# Patient Record
Sex: Male | Born: 1985 | Race: White | Hispanic: No | Marital: Single | Smoking: Never smoker
Health system: Southern US, Community
[De-identification: ages and names within clinical notes are randomized; demographics above are authoritative.]

## PROBLEM LIST (undated history)

## (undated) DIAGNOSIS — I10 Essential (primary) hypertension: Secondary | ICD-10-CM

## (undated) HISTORY — DX: Essential (primary) hypertension: I10

---

## 2016-02-13 DIAGNOSIS — H16103 Unspecified superficial keratitis, bilateral: Secondary | ICD-10-CM | POA: Diagnosis not present

## 2016-02-23 DIAGNOSIS — H16103 Unspecified superficial keratitis, bilateral: Secondary | ICD-10-CM | POA: Diagnosis not present

## 2016-04-10 DIAGNOSIS — H16103 Unspecified superficial keratitis, bilateral: Secondary | ICD-10-CM | POA: Diagnosis not present

## 2016-05-08 DIAGNOSIS — H5213 Myopia, bilateral: Secondary | ICD-10-CM | POA: Diagnosis not present

## 2016-06-28 DIAGNOSIS — H16103 Unspecified superficial keratitis, bilateral: Secondary | ICD-10-CM | POA: Diagnosis not present

## 2016-07-17 DIAGNOSIS — H16103 Unspecified superficial keratitis, bilateral: Secondary | ICD-10-CM | POA: Diagnosis not present

## 2016-10-16 ENCOUNTER — Encounter: Payer: Self-pay | Admitting: Family Medicine

## 2016-10-16 ENCOUNTER — Ambulatory Visit (INDEPENDENT_AMBULATORY_CARE_PROVIDER_SITE_OTHER): Payer: BLUE CROSS/BLUE SHIELD | Admitting: Family Medicine

## 2016-10-16 VITALS — BP 136/90 | HR 75 | Resp 12 | Ht 68.0 in | Wt 206.1 lb

## 2016-10-16 DIAGNOSIS — R030 Elevated blood-pressure reading, without diagnosis of hypertension: Secondary | ICD-10-CM

## 2016-10-16 DIAGNOSIS — L309 Dermatitis, unspecified: Secondary | ICD-10-CM

## 2016-10-16 DIAGNOSIS — R1033 Periumbilical pain: Secondary | ICD-10-CM

## 2016-10-16 MED ORDER — CLOTRIMAZOLE-BETAMETHASONE 1-0.05 % EX CREA: 1.0000 "application " | TOPICAL_CREAM | Freq: Two times a day (BID) | CUTANEOUS | 1 refills | Status: AC

## 2016-10-16 MED ORDER — MUPIROCIN CALCIUM 2 % EX CREA: 1.0000 "application " | TOPICAL_CREAM | Freq: Every day | CUTANEOUS | 0 refills | Status: AC

## 2016-10-16 NOTE — Progress Notes (Addendum)
HPI:   [name] is a 31 y.o. male, who is here today to establish care with me.  Former PCP: N/A Last preventive routine visit: Many years ago.  Chronic medical problems: Elevated BP readings, improved with low salt diet. He does not check BP's at home.  He exercises regularly and tries to follow a healthy diet. About once per week he drinks energy drinks before exercising.   Concerns today: Discharge from belly bottom and abdominal pain.  2 weeks ago he noted whitish drainage from umbilicus, associated pruritus. It is getting better. He has not tried OTC treatments. He has not used new detergent,soap,or bloody product. No insect bites or outdoors exposure. He has no prior Hx.   1 week ago he had some periumbilical abdominal pain,constant,"tightness" sensation,2-3/10,no radiated,no associated nausea or vomiting.He is afraid that this pain could be related to umbilical problem. He tells me that he was visiting his parents and ate "too much." No Hx of injuries or activities that could cause problem.  Pain has resolved.   Review of Systems  Constitutional: Negative for activity change, appetite change, fatigue, fever and unexpected weight change.  HENT: Negative for mouth sores, nosebleeds, sore throat and trouble swallowing.   Eyes: Negative for redness and visual disturbance.  Respiratory: Negative for cough, shortness of breath and wheezing.   Cardiovascular: Negative for chest pain, palpitations and leg swelling.  Gastrointestinal: Negative for blood in stool, nausea and vomiting.       No changes in bowel habits.  Genitourinary: Negative for decreased urine volume, dysuria and hematuria.  Musculoskeletal: Negative for arthralgias, back pain and myalgias.  Skin: Positive for rash. Negative for wound.  Neurological: Negative for syncope, weakness and headaches.  Hematological: Negative for adenopathy. Does not bruise/bleed easily.    Psychiatric/Behavioral: Negative for confusion. The patient is not nervous/anxious.       No current outpatient prescriptions on file prior to visit.   No current facility-administered medications on file prior to visit.      Past Medical History:  Diagnosis Date  . Hypertension    No Known Allergies  Family History  Problem Relation Age of Onset  . Diabetes Father   . Hyperlipidemia Sister     Social History   Social History  . Marital status: Single    Spouse name: N/A  . Number of children: N/A  . Years of education: N/A   Social History Main Topics  . Smoking status: Never Smoker  . Smokeless tobacco: Never Used  . Alcohol use Yes  . Drug use: No  . Sexual activity: Not Asked   Other Topics Concern  . None   Social History Narrative  . None    Vitals:   10/16/16 1159  BP: 136/90  Pulse: 75  Resp: 12  O2 sat at RA 98%  Body mass index is 31.34 kg/m.   Physical Exam  Constitutional: He is oriented to person, place, and time. He appears well-developed. No distress.  HENT:  Head: Atraumatic.  Mouth/Throat: Oropharynx is clear and moist and mucous membranes are normal.  Eyes: Conjunctivae and EOM are normal. Pupils are equal, round, and reactive to light.  Neck: No thyroid mass and no thyromegaly present.  Cardiovascular: Normal rate and regular rhythm.   No murmur heard. Pulses:      Dorsalis pedis pulses are 2+ on the right side, and 2+ on the left side.  Respiratory: Effort normal and breath sounds normal. No respiratory distress.  GI: Soft. He exhibits no mass. There is no hepatomegaly. There is no tenderness.  Musculoskeletal: He exhibits no edema.  Lymphadenopathy:    He has no cervical adenopathy.  Neurological: He is alert and oriented to person, place, and time. He has normal strength. Coordination and gait normal.  Skin: Skin is warm. Rash noted. No abrasion and no ecchymosis noted. Rash is macular. Rash is not vesicular. There is  erythema.  In umbilicus with mild erythema,no tenderness,no induration.Clear exudate appreciated,not odorous.  Psychiatric: He has a normal mood and affect.  Well groomed, good eye contact.      ASSESSMENT AND PLAN:     Bradley Pope was seen today for establish care.  Diagnoses and all orders for this visit:  Dermatitis  ? Intertrigo. Keep area dry, alcohol with a q tip can be used. Topical abx cream once daily x 7 days. Lotrisone bid,small amount for up to 14 days. It can be recurrent. Monitor for signs of infection. F/U as needed.  -     clotrimazole-betamethasone (LOTRISONE) cream; Apply 1 application topically 2 (two) times daily. -     mupirocin cream (BACTROBAN) 2 %; Apply 1 application topically daily.  Elevated blood pressure reading  Instructed to monitor BP at least once per week. Continue following low salt/DASH diet. We will follow in 3-4 months, if BP still elevated we will consider antihypertensive therapy.  Periumbilical abdominal pain  Results. I don't think this is related to umbilical rash. Instructed about warning signs and follow up as needed.    Jimmye Wisnieski G. [place], MD  [facility]. [place] office.

## 2016-10-16 NOTE — Progress Notes (Signed)
Pre visit review using our clinic review tool, if applicable. No additional management support is needed unless otherwise documented below in the visit note. 

## 2016-10-16 NOTE — Patient Instructions (Signed)
A few things to remember from today's visit:   Dermatitis - Plan: clotrimazole-betamethasone (LOTRISONE) cream, mupirocin cream (BACTROBAN) 2 %  Elevated blood pressure reading  Blood pressure goal for most people is less than 140/90. Some populations (older than 60) the goal is less than 150/90.  Most recent cardiologists' recommendations recommend blood pressure at or less than 130/80.   Elevated blood pressure increases the risk of strokes, heart and kidney disease, and eye problems. Regular physical activity and a healthy diet (DASH diet) usually help. Low salt diet. Take medications as instructed.  Caution with some over the counter medications as cold medications, dietary products (for weight loss), and Ibuprofen or Aleve (frequent use);all these medications could cause elevation of blood pressure.   Please be sure medication list is accurate. If a new problem present, please set up appointment sooner than planned today.

## 2017-04-24 DIAGNOSIS — H16103 Unspecified superficial keratitis, bilateral: Secondary | ICD-10-CM | POA: Diagnosis not present

## 2017-05-29 DIAGNOSIS — Z23 Encounter for immunization: Secondary | ICD-10-CM | POA: Diagnosis not present

## 2017-07-03 DIAGNOSIS — H16101 Unspecified superficial keratitis, right eye: Secondary | ICD-10-CM | POA: Diagnosis not present

## 2017-08-20 ENCOUNTER — Encounter: Payer: Self-pay | Admitting: Family Medicine

## 2017-08-20 ENCOUNTER — Ambulatory Visit (INDEPENDENT_AMBULATORY_CARE_PROVIDER_SITE_OTHER): Payer: BLUE CROSS/BLUE SHIELD | Admitting: Family Medicine

## 2017-08-20 VITALS — BP 126/72 | HR 74 | Temp 98.2°F | Resp 12 | Ht 68.0 in | Wt 227.0 lb

## 2017-08-20 DIAGNOSIS — Z0000 Encounter for general adult medical examination without abnormal findings: Secondary | ICD-10-CM | POA: Diagnosis not present

## 2017-08-20 DIAGNOSIS — Z13220 Encounter for screening for lipoid disorders: Secondary | ICD-10-CM

## 2017-08-20 DIAGNOSIS — Z131 Encounter for screening for diabetes mellitus: Secondary | ICD-10-CM

## 2017-08-20 NOTE — Progress Notes (Signed)
HPI:  [name] is a 32 y.o.male here today for his routine physical examination.  Last CPE: Over a year ago. He lives with his sister.  Regular exercise 3 or more times per week: He plays basketball and lifts wts 2-3 times per week. Following a healthy diet: Yes.   Chronic medical problems: Otherwise healthy.  Hx of STD's: Denies.   There is no immunization history on file for this patient.  -Denies high alcohol intake, tobacco use, or Hx of illicit drug use.  -Concerns and/or follow up today:   He would like lab work done,concerned about CVD risk. His first cousin recently died from CVA at age 31 a couple days ago.  For about 2 days he has been more aware of his breathing,states that a few times he feels like he needs to breath deeper. This happens at rest, no dyspnea or chest pain. He denies associated diaphoresis. He is under some stress at work.  He reports an ER visit about 7 years ago because SOB, states that work up included EKG and CXR and "everything was fine."  Review of Systems  Constitutional: Negative for activity change, appetite change, fatigue, fever and unexpected weight change.  HENT: Negative for dental problem, mouth sores, nosebleeds, sore throat, trouble swallowing and voice change.   Eyes: Negative for redness and visual disturbance.  Respiratory: Negative for apnea, cough, shortness of breath and wheezing.   Cardiovascular: Negative for chest pain, palpitations and leg swelling.  Gastrointestinal: Negative for abdominal pain, blood in stool, nausea and vomiting.  Endocrine: Negative for cold intolerance, heat intolerance, polydipsia, polyphagia and polyuria.  Genitourinary: Negative for decreased urine volume, dysuria, genital sores, hematuria and testicular pain.  Musculoskeletal: Negative for back pain, gait problem and myalgias.  Skin: Negative for color change and rash.  Neurological: Negative for dizziness, seizures, syncope,  weakness, numbness and headaches.  Hematological: Negative for adenopathy. Does not bruise/bleed easily.  Psychiatric/Behavioral: Negative for confusion and sleep disturbance. The patient is nervous/anxious.   All other systems reviewed and are negative.    No current outpatient medications on file prior to visit.   No current facility-administered medications on file prior to visit.      Past Medical History:  Diagnosis Date  . Hypertension     History reviewed. No pertinent surgical history.  No Known Allergies  Family History  Problem Relation Age of Onset  . Diabetes Father   . Hyperlipidemia Father   . Hyperlipidemia Sister   . Stroke Maternal Uncle     Social History   Socioeconomic History  . Marital status: Single    Spouse name: None  . Number of children: None  . Years of education: None  . Highest education level: None  Social Needs  . Financial resource strain: None  . Food insecurity - worry: None  . Food insecurity - inability: None  . Transportation needs - medical: None  . Transportation needs - non-medical: None  Occupational History  . None  Tobacco Use  . Smoking status: Never Smoker  . Smokeless tobacco: Never Used  Substance and Sexual Activity  . Alcohol use: Yes  . Drug use: No  . Sexual activity: No  Other Topics Concern  . None  Social History Narrative  . None     Vitals:   08/20/17 1037  BP: 126/72  Pulse: 74  Resp: 12  Temp: 98.2 F (36.8 C)  SpO2: 98%   Body mass index is 34.52  kg/m.   Wt Readings from Last 3 Encounters:  08/20/17 227 lb (103 kg)  10/16/16 206 lb 2 oz (93.5 kg)    Physical Exam  Nursing note and vitals reviewed. Constitutional: He is oriented to person, place, and time. He appears well-developed. No distress.  HENT:  Head: Normocephalic and atraumatic.  Right Ear: Hearing, tympanic membrane, external ear and ear canal normal.  Left Ear: Hearing, tympanic membrane, external ear and ear  canal normal.  Mouth/Throat: Oropharynx is clear and moist and mucous membranes are normal.  Eyes: Conjunctivae and EOM are normal. Pupils are equal, round, and reactive to light.  Neck: Normal range of motion. No tracheal deviation present. No thyromegaly present.  Cardiovascular: Normal rate and regular rhythm.  No murmur heard. Pulses:      Dorsalis pedis pulses are 2+ on the right side, and 2+ on the left side.  Respiratory: Effort normal and breath sounds normal. No respiratory distress. He exhibits no tenderness.  GI: Soft. He exhibits no mass. There is no tenderness.  Genitourinary:  Genitourinary Comments: Refused,no concerns.  Musculoskeletal: He exhibits no edema or tenderness.  No major deformities appreciated and no signs of synovitis.  Lymphadenopathy:    He has no cervical adenopathy.       Right: No supraclavicular adenopathy present.       Left: No supraclavicular adenopathy present.  Neurological: He is alert and oriented to person, place, and time. He has normal strength. No cranial nerve deficit or sensory deficit. Coordination and gait normal.  Reflex Scores:      Bicep reflexes are 2+ on the right side and 2+ on the left side.      Patellar reflexes are 2+ on the right side and 2+ on the left side. Skin: Skin is warm. No rash noted. No erythema.  Psychiatric: Cognition and memory are normal.  Well groomed,good eye contact.     ASSESSMENT AND PLAN:   Mr Bradley Pope was seen today for annual exam.  Diagnoses and all orders for this visit:  Lab Results  Component Value Date   CHOL 179 08/21/2017   HDL 48.20 08/21/2017   LDLCALC 115 (H) 08/21/2017   TRIG 83.0 08/21/2017   CHOLHDL 4 08/21/2017   Lab Results  Component Value Date   CREATININE 0.99 08/21/2017   BUN 14 08/21/2017   NA 138 08/21/2017   K 4.2 08/21/2017   CL 102 08/21/2017   CO2 30 08/21/2017    Routine general medical examination at a health care facility  We discussed the importance of  regular physical activity and healthy diet for prevention of chronic illness and/or complications. Preventive guidelines reviewed. Vaccination reported as up to date. We discussed CVD risk factors. Next CPE in a year.  Screening for diabetes mellitus -     Basic metabolic panel; Future  Screening for lipid disorders -     Lipid panel; Future     Return in 1 year (on 08/20/2018).    Bronsen Serano G. [place], MD  [facility] [place] office.

## 2017-08-20 NOTE — Patient Instructions (Signed)
A few things to remember from today's visit:   Routine general medical examination at a health care facility  Screening for diabetes mellitus - Plan: Basic metabolic panel  Screening for lipid disorders - Plan: Lipid panel   At least 150 minutes of moderate exercise per week, daily brisk walking for 15-30 min is a good exercise option. Healthy diet low in saturated (animal) fats and sweets and consisting of fresh fruits and vegetables, lean meats such as fish and white chicken and whole grains.  - Vaccines:  Tdap vaccine every 10 years.  Shingles vaccine recommended at age 31, could be given after 31 years of age but not sure about insurance coverage.  Pneumonia vaccines:  Prevnar 13 at 65 and Pneumovax at [address].   -Screening recommendations for low/normal risk males:  Screening for diabetes at age 31 and every 3 years. Earlier screening if cardiovascular risk factors.   Lipid screening at 35 and every 3 years. Screening starts in younger males with cardiovascular risk factors.  Colon cancer screening at age 31 and until age 31.  Prostate cancer screening: some controversy, starts usually at 50: Rectal exam and PSA.  Aortic Abdominal Aneurism once between 3565 and 31 years old if ever smoker.  Also recommended:  1. Dental visit- Brush and floss your teeth twice daily; visit your dentist twice a year. 2. Eye doctor- Get an eye exam at least every 2 years. 3. Helmet use- Always wear a helmet when riding a bicycle, motorcycle, rollerblading or skateboarding. 4. Safe sex- If you may be exposed to sexually transmitted infections, use a condom. 5. Seat belts- Seat belts can save your live; always wear one. 6. Smoke/Carbon Monoxide detectors- These detectors need to be installed on the appropriate level of your home. Replace batteries at least once a year. 7. Skin cancer- When out in the sun please cover up and use sunscreen 15 SPF or higher. 8. Violence- If anyone is threatening or  hurting you, please tell your healthcare provider.  9. Drink alcohol in moderation- Limit alcohol intake to one drink or less per day. Never drink and drive.  Please be sure medication list is accurate. If a new problem present, please set up appointment sooner than planned today.

## 2017-08-21 LAB — BASIC METABOLIC PANEL
BUN: 14 mg/dL (ref 6–23)
CHLORIDE: 102 meq/L (ref 96–112)
CO2: 30 mEq/L (ref 19–32)
Calcium: 9.3 mg/dL (ref 8.4–10.5)
Creatinine, Ser: 0.99 mg/dL (ref 0.40–1.50)
GFR: 93.72 mL/min (ref >60.00)
Glucose, Bld: 89 mg/dL (ref 70–99)
POTASSIUM: 4.2 meq/L (ref 3.5–5.1)
Sodium: 138 mEq/L (ref 135–145)

## 2017-08-21 LAB — LIPID PANEL
CHOLESTEROL: 179 mg/dL (ref 0–200)
HDL: 48.2 mg/dL (ref >39.00)
LDL CALC: 115 mg/dL — AB (ref 0–99)
NonHDL: 131.27
TRIGLYCERIDES: 83 mg/dL (ref 0.0–149.0)
Total CHOL/HDL Ratio: 4
VLDL: 16.6 mg/dL (ref 0.0–40.0)

## 2017-08-25 ENCOUNTER — Encounter: Payer: Self-pay | Admitting: Family Medicine

## 2017-12-17 DIAGNOSIS — H16101 Unspecified superficial keratitis, right eye: Secondary | ICD-10-CM | POA: Diagnosis not present

## 2017-12-23 ENCOUNTER — Ambulatory Visit: Payer: BLUE CROSS/BLUE SHIELD | Admitting: Sports Medicine

## 2017-12-23 ENCOUNTER — Ambulatory Visit
Admission: RE | Admit: 2017-12-23 | Discharge: 2017-12-23 | Disposition: A | Discharge status: Home / Self Care | Payer: BLUE CROSS/BLUE SHIELD | Source: Ambulatory Visit | Attending: Sports Medicine | Admitting: Sports Medicine

## 2017-12-23 VITALS — BP 126/78 | Ht 68.0 in | Wt 230.0 lb

## 2017-12-23 DIAGNOSIS — M79671 Pain in right foot: Secondary | ICD-10-CM

## 2017-12-23 MED ORDER — MELOXICAM 15 MG PO TABS: ORAL_TABLET | ORAL | 0 refills | Status: DC

## 2017-12-23 NOTE — Progress Notes (Signed)
   Subjective:    Patient ID: Bradley Pope, male    DOB: 10/05/1985, 32 y.o.   MRN: [id number]  HPI Pt presents with R foot pain for approximately 3 weeks. He was wearing old dress shoes [place] [facility] and later that day had R mid foot pain. The pain seemed to get better over the next few days and then he ran in flip flops on Memorial [place] and aggravated it. He has been having sharp mid R dorsal foot pain and thinks that he is modifying his gait to accommodate for it as he has had some R heel pain on the plantar surface for the past couple of days. He has had to stay NWB at times and borrowed a crutch to use on the R side. Pain gets up to 8-10/10. No pain currently but he limped in from the parking lot. He thinks that now some of the limping is due to fear of pain. He has been icing daily and has taken 200mg  ibuprofen on occasion (counseled on more appropriate dosing). He has also soaked it in hot water a couple of times. For exercise he bikes occasionally, usually plays basketball once a week and lifts weights. He has not done any physical activity for the past 3 weeks due to pain. He is no longer wearing those shoes. He wraps his foot at night with an ACE wrap. His foot was red and swollen the first couple of days of pain. He feels that he is rolling his R foot out laterally to help with pain. He has a hx of what he calls tendonitis in this same location 3x while in middle school and this feels similar.    Review of Systems As above    Objective:   Physical Exam Seated on the exam table in NAD. Obese. R foot: NL inspection aside from fainy erythema mid dorsum of R foot, no obvious swelling ? Minimally TTP over R proximal 3rd metatarsal, R heel is NTTP NL ROM of foot and ankle, NL dorsi and plantar flexion of foot, NL mvmt in all 5 toes NL strength of ankle and foot R ankle and foot are stable, pes planus visible with  standing NVI  Limited BS US shows inflammation and swelling overlying dorsal aspect of R 3rd metatarsal.      Assessment & Plan:  Subacute R foot pain with pes planus No acute injury but pt may have a stress fx. Will send for a 3 view R foot XR to r/o stress fx. Arch strap for extra support. Green shoe inserts with B scaphoid pads for extra support. Scaphoid pads in dress shoes as their inserts cannot be removed. Consider custom orthotics if improvement with these measures. Rx Mobic. 3 week office f/u for re-eval. Consider R foot MRI if no improvement. Cycling and swimming are fine as they are NWB. Open chain and upper body weight lifting are fine to resume. Avoid close chain exercises. (Reviewed with pt.)  Addendum: X-rays reviewed. They're unremarkable. Treatment as above and follow-up in 3 weeks. If symptoms are improving, consider custom orthotics. If symptoms persist, consider merits of further diagnostic imaging.

## 2018-01-07 DIAGNOSIS — H16101 Unspecified superficial keratitis, right eye: Secondary | ICD-10-CM | POA: Diagnosis not present

## 2018-01-13 ENCOUNTER — Ambulatory Visit: Payer: BLUE CROSS/BLUE SHIELD | Admitting: Sports Medicine

## 2018-01-13 VITALS — BP 112/72 | Ht 68.0 in | Wt 227.0 lb

## 2018-01-13 DIAGNOSIS — S93609D Unspecified sprain of unspecified foot, subsequent encounter: Secondary | ICD-10-CM

## 2018-01-13 NOTE — Progress Notes (Signed)
Subjective:    Patient ID: Bradley Pope, male    DOB: 07/19/1985, 32 y.o.   MRN: [id number]  HPI Patient is a 32 yo male who present today to follow up on his right foot pain. Patient was seen about 3 weeks ago for dorsal foot pain with pes planus. Patient was given scaphoid pads and arch strap to help with foot pain. Patient reports initially he had some pain in his right leg due to the scaphoid pad but pain improve over time as he became more used to wearing the pads. He also wore the elastic strap while at home. Last Sunday (6/26) patient was doing kettle bell exercise when  He felt pain in his foot. Patient reports that he was pain free for 3 days then last Wednesday he had similar foot pain, initially on the plantar and dorsal aspect of his foot but then mostly localized to the dorsal aspect of his right foot in the midfoot region. Patient resumed Meloxicam for about 3 days with improvement in his pain. This morning, patient reports some pain causing him to limp with ambulation but not as severe as before.  Patient wanted to have foot examine again prior to taking a trip in [place].   Review of Systems  Constitutional: Negative.   HENT: Negative.   Eyes: Negative.   Respiratory: Negative.   Cardiovascular: Negative.   Gastrointestinal: Negative.   Endocrine: Negative.   Genitourinary: Negative.   Musculoskeletal:       Right foot pain  Skin: Negative.   Neurological: Negative.   Hematological: Negative.   Psychiatric/Behavioral: Negative.        Objective:   Physical Exam Right foot: No swelling, effusion, erythema or deformity noted. Patient tender to palpation on the dorsal aspect of the right midfoot. Normal ROM for the foot and ankle, dorsiflexion and plantar flexion within normal limit. Pulses palpated, foot is warm. Strength and sensation intact.     Assessment & Plan:  Right foot pain, chronic, intermittent Patient presents today to follow up on right foot pain.  Xray of the foot was negative from any stress fracture and US in the office show evidence of inflammation. Patient appeared to be improving with scaphoid pad and NSAIDs but appears to have reinjure his foot. Unclear what the cause of his pain is. Could still have stress fracture, however for that patient would need MRI for further evaluation. Given improvement with pads and NSAIDs recommended continuation of conservative therapy with reduction in activity that could increase likelikhood of reinjury. Patient will follow up after his trip to [place] to assess need for further imaging or continuation of current therapy. Patient provided with multiple scaphoid pad insert to be used in other shoes.  Patient seen and evaluated with the resident. I agree with the above plan of care. Patient's location of pain today is primarily over the midfoot which would be an unusual source of stress injury. X-rays done 3 weeks ago were unremarkable. I think he is most likely dealing with a mild midfoot sprain. I discussed continuing with watchful waiting in addition to adding other scaphoid pads to his dress shoes versus MRI for a more definitive diagnosis. Patient prefers to continue with our current treatment for now. We've given him additional scaphoid pads for other shoes. I've asked him to contact me upon his return to [place] for an update. If his symptoms continue to improve then he will return to the office for custom orthotics. However, if symptoms  persist or worsen we may want to reconsider merits of MRI.

## 2018-01-20 ENCOUNTER — Other Ambulatory Visit: Payer: Self-pay

## 2018-01-20 MED ORDER — MELOXICAM 15 MG PO TABS: ORAL_TABLET | ORAL | 0 refills | Status: AC

## 2018-08-26 ENCOUNTER — Encounter: Payer: Self-pay | Admitting: Sports Medicine

## 2018-08-26 ENCOUNTER — Ambulatory Visit: Payer: BLUE CROSS/BLUE SHIELD | Admitting: Sports Medicine

## 2018-08-26 VITALS — BP 120/70 | Ht 68.0 in | Wt 223.0 lb

## 2018-08-26 DIAGNOSIS — M25561 Pain in right knee: Secondary | ICD-10-CM | POA: Diagnosis not present

## 2018-08-27 ENCOUNTER — Encounter: Payer: Self-pay | Admitting: Sports Medicine
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
## 2018-12-19 DIAGNOSIS — M25561 Pain in right knee: Secondary | ICD-10-CM | POA: Diagnosis not present

## 2018-12-19 DIAGNOSIS — M542 Cervicalgia: Secondary | ICD-10-CM | POA: Diagnosis not present

## 2018-12-19 DIAGNOSIS — Z6838 Body mass index (BMI) 38.0-38.9, adult: Secondary | ICD-10-CM | POA: Diagnosis not present

## 2018-12-19 DIAGNOSIS — M25562 Pain in left knee: Secondary | ICD-10-CM | POA: Diagnosis not present

## 2019-12-09 DIAGNOSIS — R1012 Left upper quadrant pain: Secondary | ICD-10-CM | POA: Diagnosis not present

## 2019-12-09 DIAGNOSIS — A084 Viral intestinal infection, unspecified: Secondary | ICD-10-CM | POA: Diagnosis not present

## 2019-12-09 DIAGNOSIS — Z6837 Body mass index (BMI) 37.0-37.9, adult: Secondary | ICD-10-CM | POA: Diagnosis not present

## 2020-01-05 DIAGNOSIS — R1013 Epigastric pain: Secondary | ICD-10-CM | POA: Diagnosis not present

## 2020-01-05 DIAGNOSIS — Z6837 Body mass index (BMI) 37.0-37.9, adult: Secondary | ICD-10-CM | POA: Diagnosis not present

## 2020-01-26 DIAGNOSIS — F4321 Adjustment disorder with depressed mood: Secondary | ICD-10-CM | POA: Diagnosis not present

## 2020-02-04 DIAGNOSIS — F4321 Adjustment disorder with depressed mood: Secondary | ICD-10-CM | POA: Diagnosis not present

## 2020-02-07 IMAGING — CR DG FOOT COMPLETE 3+V*R*
3 series · 3 of 3 positions shown · non-contrast
Comparison: None.

CLINICAL DATA: Metatarsal pain over the last 2 weeks.

EXAM:
RIGHT FOOT COMPLETE - 3+ VIEW

[t foot ap right]
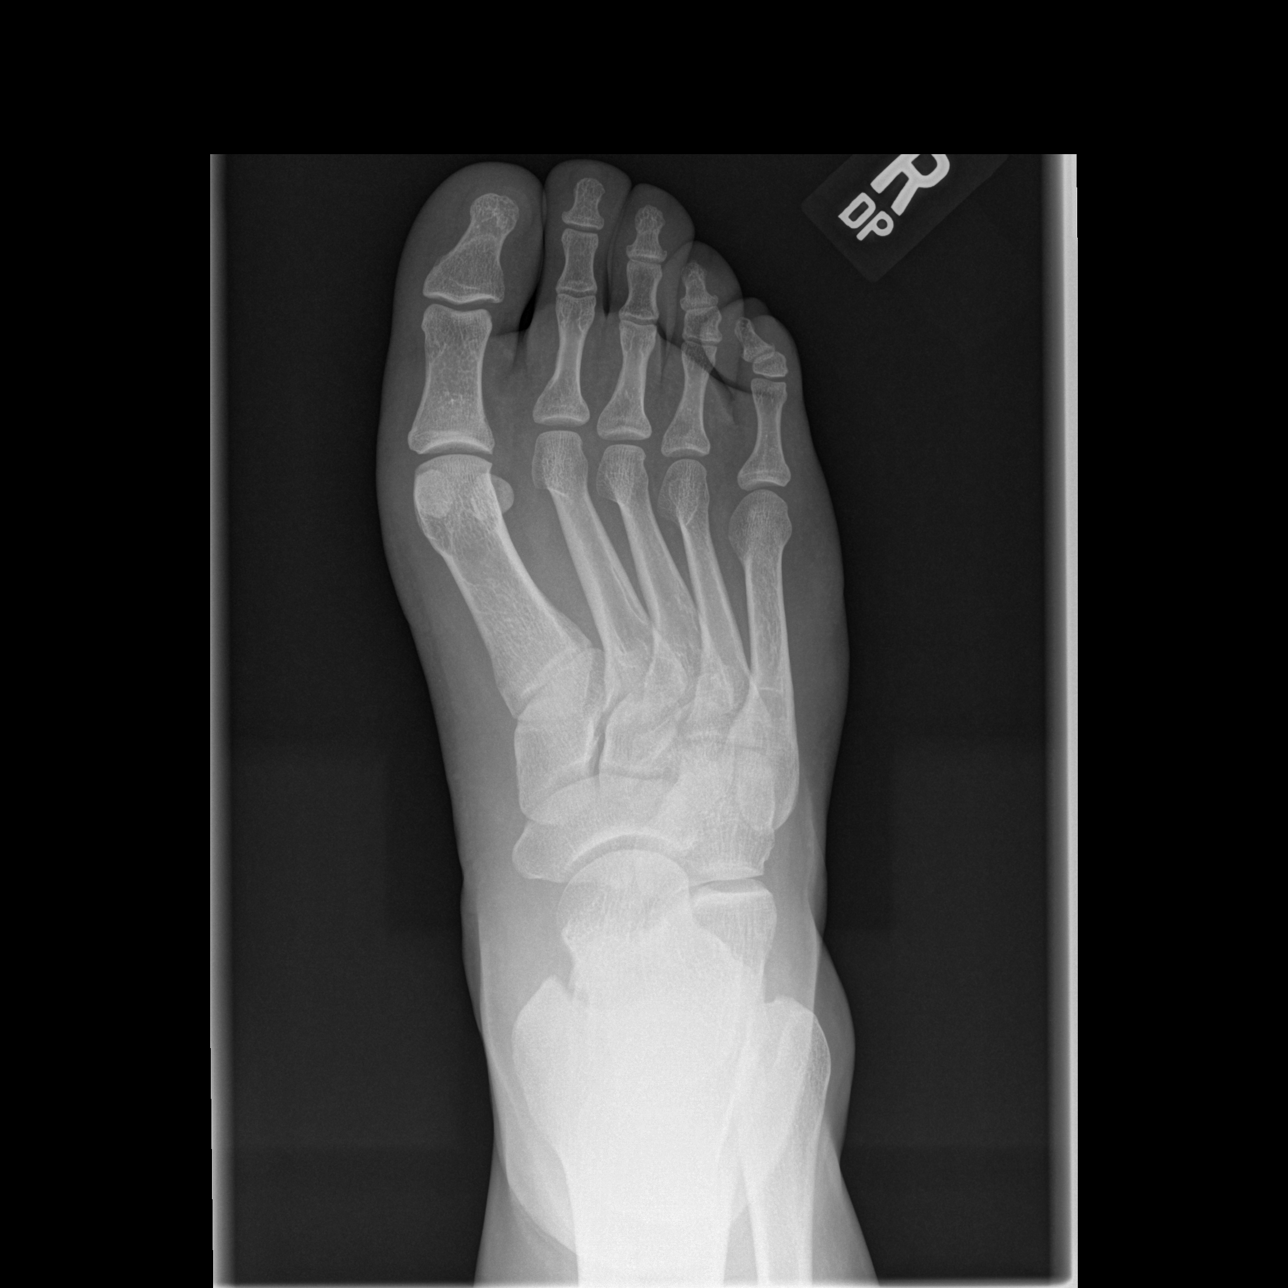

[t foot oblique right]
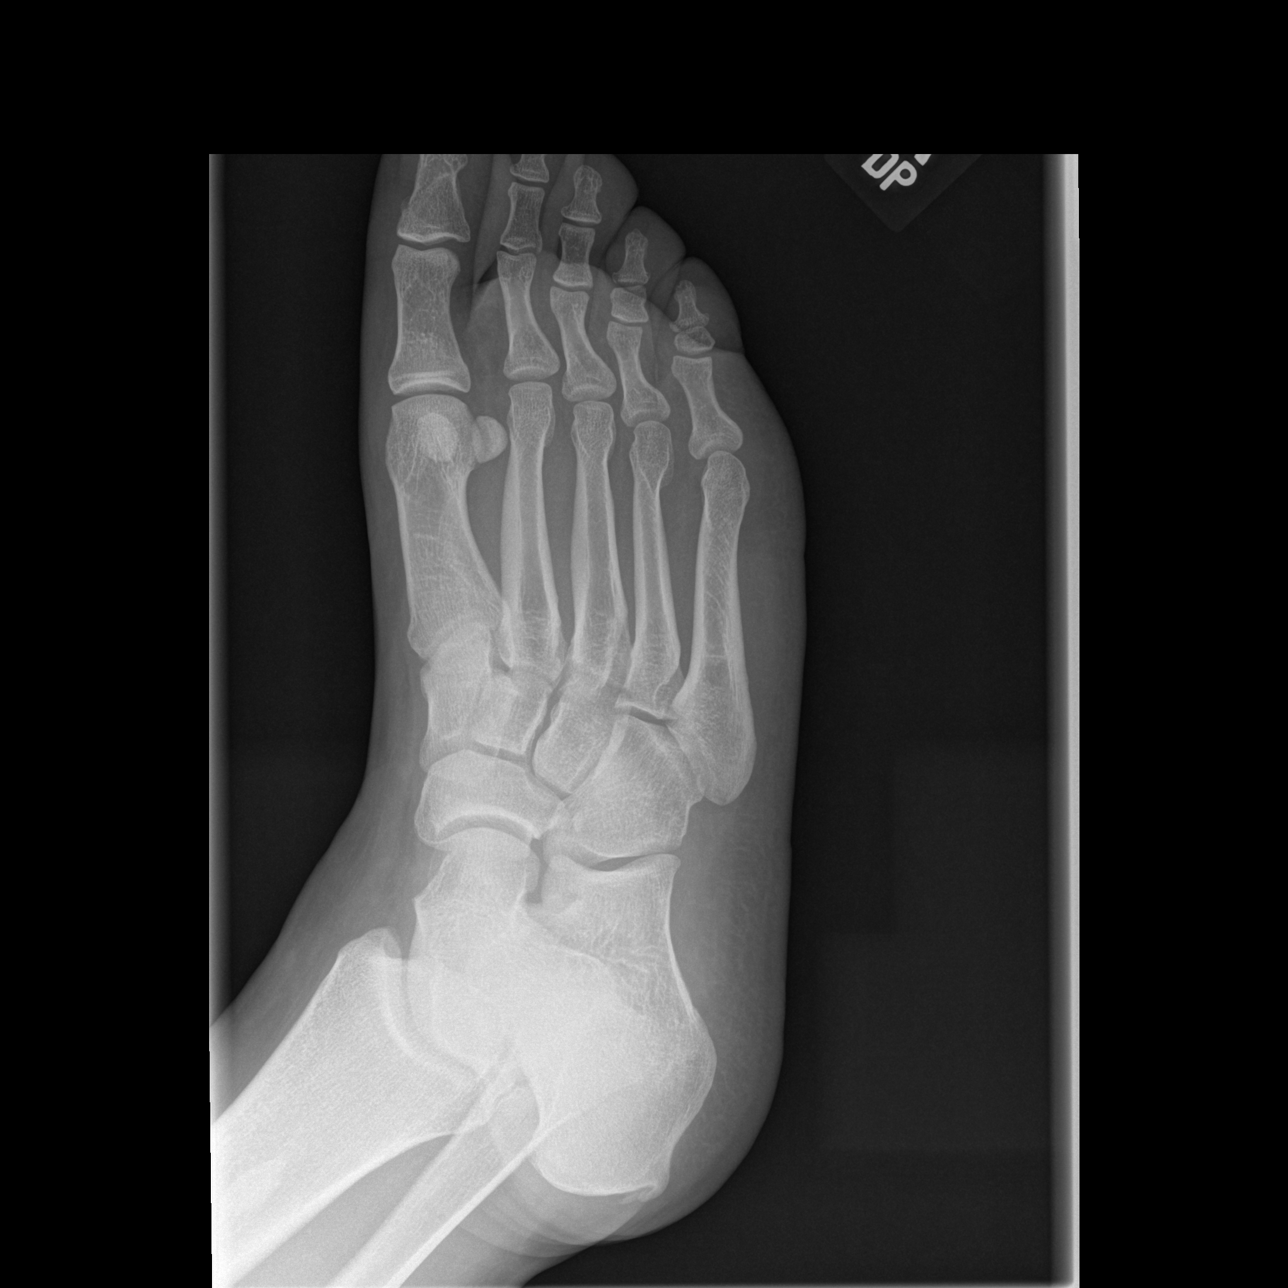

[t foot lat right]
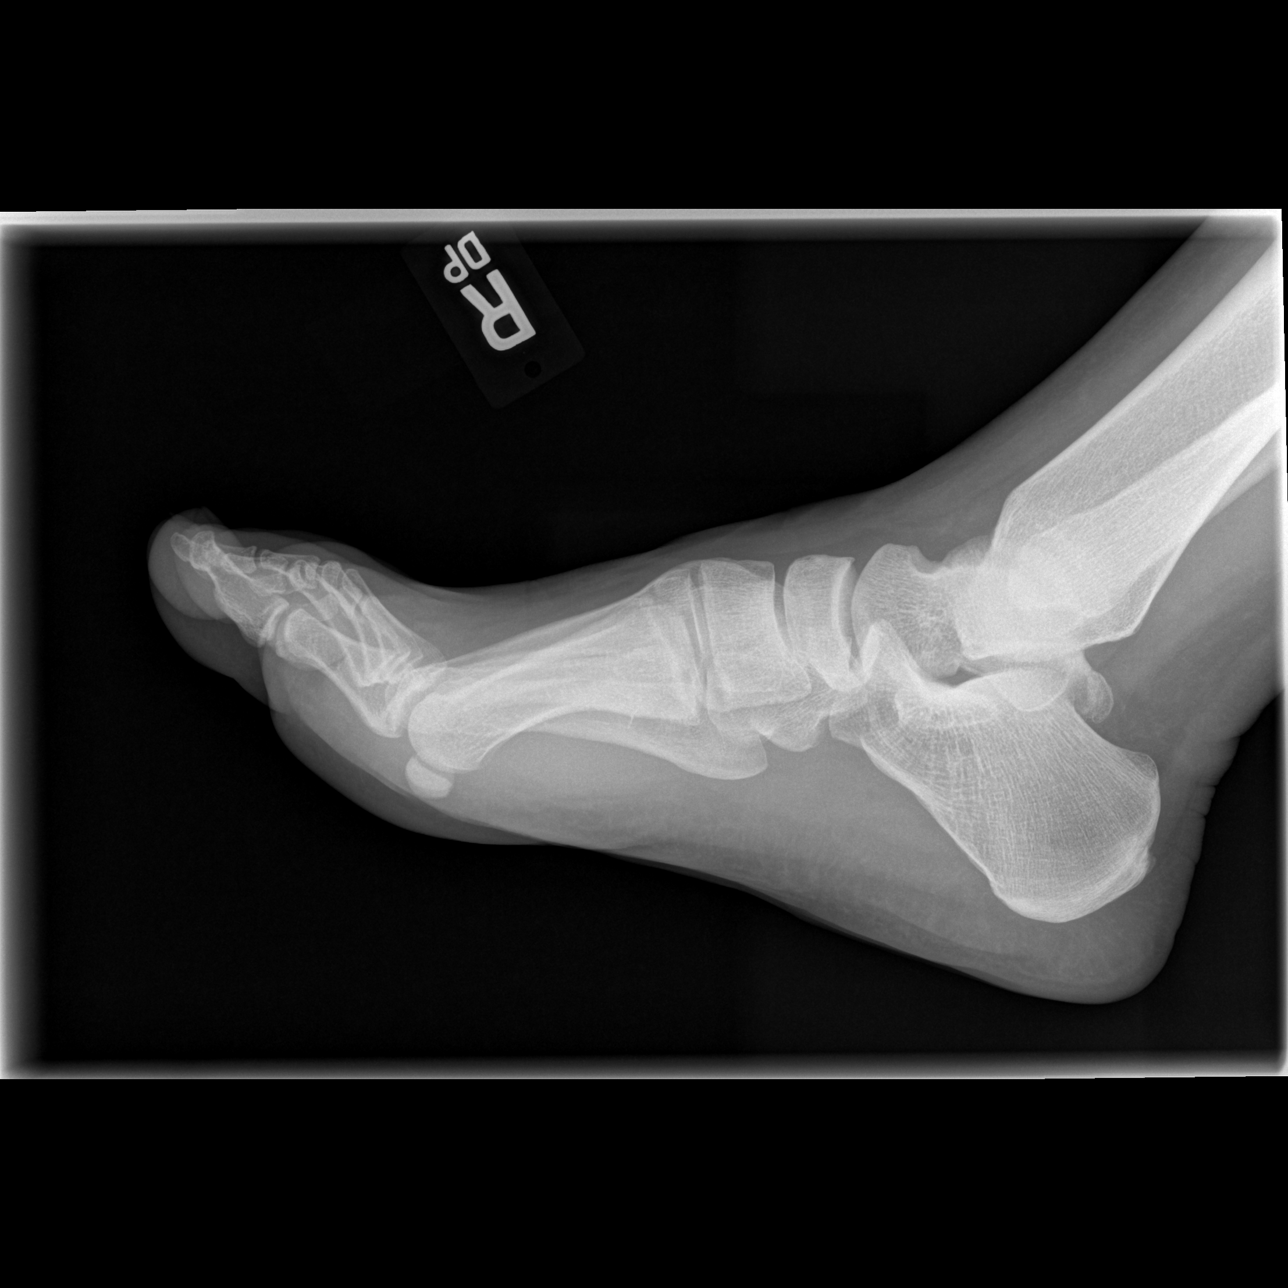

[3 of 3 positions shown; findings below may reference images not displayed]

FINDINGS: No evidence of fracture or stress fracture. No degenerative changes.
No other focal finding. No evidence of coalition. Normal foot arch.
IMPRESSION: Normal radiographs.

## 2020-03-01 DIAGNOSIS — F4321 Adjustment disorder with depressed mood: Secondary | ICD-10-CM | POA: Diagnosis not present

## 2020-03-09 DIAGNOSIS — Z6838 Body mass index (BMI) 38.0-38.9, adult: Secondary | ICD-10-CM | POA: Diagnosis not present

## 2020-03-09 DIAGNOSIS — K589 Irritable bowel syndrome without diarrhea: Secondary | ICD-10-CM | POA: Diagnosis not present

## 2020-03-09 DIAGNOSIS — R12 Heartburn: Secondary | ICD-10-CM | POA: Diagnosis not present

## 2020-04-05 DIAGNOSIS — F4321 Adjustment disorder with depressed mood: Secondary | ICD-10-CM | POA: Diagnosis not present

## 2020-04-29 DIAGNOSIS — F4321 Adjustment disorder with depressed mood: Secondary | ICD-10-CM | POA: Diagnosis not present

## 2020-05-27 DIAGNOSIS — F4321 Adjustment disorder with depressed mood: Secondary | ICD-10-CM | POA: Diagnosis not present

## 2020-05-30 DIAGNOSIS — M79672 Pain in left foot: Secondary | ICD-10-CM | POA: Diagnosis not present

## 2020-05-30 DIAGNOSIS — M79671 Pain in right foot: Secondary | ICD-10-CM | POA: Diagnosis not present

## 2020-06-15 DIAGNOSIS — Z20822 Contact with and (suspected) exposure to covid-19: Secondary | ICD-10-CM | POA: Diagnosis not present
